# Patient Record
Sex: Male | Born: 1970 | Race: White | Hispanic: No | Marital: Married | Smoking: Current every day smoker
Health system: Southern US, Community
[De-identification: ages and names within clinical notes are randomized; demographics above are authoritative.]

## PROBLEM LIST (undated history)

## (undated) HISTORY — PX: APPENDECTOMY: SHX54

---

## 2008-07-31 ENCOUNTER — Emergency Department: Payer: Self-pay | Admitting: Emergency Medicine

## 2009-01-05 ENCOUNTER — Emergency Department: Payer: Self-pay | Admitting: Emergency Medicine

## 2009-01-22 ENCOUNTER — Emergency Department: Payer: Self-pay | Admitting: Emergency Medicine

## 2009-04-15 ENCOUNTER — Emergency Department: Payer: Self-pay | Admitting: Emergency Medicine

## 2010-02-11 ENCOUNTER — Ambulatory Visit: Payer: Self-pay

## 2014-02-05 ENCOUNTER — Ambulatory Visit: Payer: Self-pay | Admitting: Ophthalmology

## 2014-02-05 LAB — POTASSIUM: POTASSIUM: 3.4 mmol/L — AB (ref 3.5–5.1)

## 2014-02-24 ENCOUNTER — Ambulatory Visit: Payer: Self-pay | Admitting: Ophthalmology

## 2014-03-24 ENCOUNTER — Ambulatory Visit: Payer: Self-pay | Admitting: Ophthalmology

## 2014-11-17 ENCOUNTER — Emergency Department: Payer: Self-pay | Admitting: Emergency Medicine

## 2014-11-24 ENCOUNTER — Emergency Department: Payer: Self-pay | Admitting: Emergency Medicine

## 2014-12-19 NOTE — Op Note (Signed)
PATIENT NAME:  Joseph Frank, [name] MR#:  [id number] DATE OF BIRTH:  [birth year]  DATE OF PROCEDURE:  02/24/2014  PREOPERATIVE DIAGNOSIS: Visually significant cataract of the right eye.   POSTOPERATIVE DIAGNOSIS: Visually significant cataract of the right eye.   OPERATIVE PROCEDURE: Cataract extraction by phacoemulsification with implant of intraocular lens to the right eye.   SURGEON: Galen ManilaWilliam Jadi Deyarmin, MD  ANESTHESIA:  1. Managed anesthesia care.  2. 50-50 mixture of 0.75% bupivacaine and 4% Xylocaine given as a retrobulbar block.   COMPLICATIONS: None.   TECHNIQUE:  Stop and chop.  DESCRIPTION OF PROCEDURE: The patient was examined and consented for this procedure in the preoperative holding area and then brought back to the Operating Room where the anesthesia team employed managed anesthesia care.  3.5 milliliters of the aforementioned mixture were placed in the right orbit on an Atkinson needle without complication. The right eye was then prepped and draped in the usual sterile ophthalmic fashion. A lid speculum was placed. The side-port blade was used to create a paracentesis and the anterior chamber was filled with viscoelastic. The keratome was used to create a near clear corneal incision. The continuous curvilinear capsulorrhexis was performed with a cystotome followed by the capsulorrhexis forceps. Hydrodissection and hydrodelineation were carried out with BSS on a blunt cannula. The lens was removed in a stop and chop technique. The remaining cortical material was removed with the irrigation-aspiration handpiece. The capsular bag was inflated with viscoelastic and the Tecnis ZCB00 19.0-diopter lens, serial number [id number] was placed in the capsular bag without complication. The remaining viscoelastic was removed from the eye with the irrigation-aspiration handpiece. The wounds were hydrated. The anterior chamber was flushed with Miostat and the eye was inflated to a physiologic pressure.  0.1 mL of cefuroxime concentration 10 mg/mL was placed in the anterior chamber. The wounds were found to be water tight. The eye was dressed with Vigamox followed by Maxitrol ointment and a protective shield was placed. The patient will followup with me in one day.   Please note the Vision Blue was used to stain the anterioir capsule as this was an entirely white cataract.   ____________________________ Jerilee FieldWilliam L. Georgiann Neider, MD wlp:dd D: 02/24/2014 14:27:45 ET T: 02/24/2014 22:00:56 ET JOB#: [id number]  cc: Garin Mata L. Mikell Camp, MD, <Dictator> Jerilee FieldWILLIAM L Jaidee Stipe MD ELECTRONICALLY SIGNED 02/26/2014 10:50

## 2015-06-19 ENCOUNTER — Emergency Department
Admission: EM | Admit: 2015-06-19 | Discharge: 2015-06-19 | Disposition: A | Discharge status: Home / Self Care | Payer: Self-pay | Attending: Emergency Medicine | Admitting: Emergency Medicine

## 2015-06-19 ENCOUNTER — Encounter: Payer: Self-pay | Admitting: Emergency Medicine

## 2015-06-19 ENCOUNTER — Emergency Department: Payer: Self-pay

## 2015-06-19 DIAGNOSIS — Z720 Tobacco use: Secondary | ICD-10-CM | POA: Insufficient documentation

## 2015-06-19 DIAGNOSIS — J209 Acute bronchitis, unspecified: Secondary | ICD-10-CM | POA: Insufficient documentation

## 2015-06-19 LAB — CBC
HCT: 41.7 % (ref 40.0–52.0)
Hemoglobin: 14.4 g/dL (ref 13.0–18.0)
MCH: 32 pg (ref 26.0–34.0)
MCHC: 34.5 g/dL (ref 32.0–36.0)
MCV: 92.7 fL (ref 80.0–100.0)
PLATELETS: 200 10*3/uL (ref 150–440)
RBC: 4.5 MIL/uL (ref 4.40–5.90)
RDW: 13.3 % (ref 11.5–14.5)
WBC: 8.3 10*3/uL (ref 3.8–10.6)

## 2015-06-19 LAB — COMPREHENSIVE METABOLIC PANEL
ALK PHOS: 58 U/L (ref 38–126)
ALT: 35 U/L (ref 17–63)
ANION GAP: 8 (ref 5–15)
AST: 25 U/L (ref 15–41)
Albumin: 4.6 g/dL (ref 3.5–5.0)
BUN: 16 mg/dL (ref 6–20)
CALCIUM: 9.8 mg/dL (ref 8.9–10.3)
CO2: 28 mmol/L (ref 22–32)
CREATININE: 0.88 mg/dL (ref 0.61–1.24)
Chloride: 105 mmol/L (ref 101–111)
Glucose, Bld: 90 mg/dL (ref 65–99)
Potassium: 3.8 mmol/L (ref 3.5–5.1)
SODIUM: 141 mmol/L (ref 135–145)
Total Bilirubin: 0.5 mg/dL (ref 0.3–1.2)
Total Protein: 7.9 g/dL (ref 6.5–8.1)

## 2015-06-19 LAB — LIPASE, BLOOD: Lipase: 30 U/L (ref 11–51)

## 2015-06-19 LAB — TROPONIN I

## 2015-06-19 MED ORDER — NAPROXEN 500 MG PO TABS: 500.0000 mg | ORAL_TABLET | Freq: Two times a day (BID) | ORAL | Status: AC

## 2015-06-19 MED ORDER — ALBUTEROL SULFATE HFA 108 (90 BASE) MCG/ACT IN AERS: 2.0000 | INHALATION_SPRAY | RESPIRATORY_TRACT | Status: AC | PRN

## 2015-06-19 MED ORDER — IPRATROPIUM-ALBUTEROL 0.5-2.5 (3) MG/3ML IN SOLN
3.0000 mL | Freq: Once | RESPIRATORY_TRACT | Status: AC
  Administered 2015-06-19: 3 mL via RESPIRATORY_TRACT
  Filled 2015-06-19: qty 3

## 2015-06-19 MED ORDER — PREDNISONE 20 MG PO TABS: 40.0000 mg | ORAL_TABLET | Freq: Every day | ORAL | Status: AC

## 2015-06-19 MED ORDER — PREDNISONE 20 MG PO TABS
60.0000 mg | ORAL_TABLET | Freq: Once | ORAL | Status: AC
  Administered 2015-06-19: 60 mg via ORAL
  Filled 2015-06-19: qty 3

## 2015-06-19 NOTE — ED Notes (Addendum)
Pt c/o intermittent right sided chest pain with right upper quadrant pain for 2-3 weeks; short of breath with pain; denies diaphoresis and N/V with pain; "I know I got the smoker's cough"; nonproductive; currently pain free; has not experienced the pain in at least 2 days; admits he's here because his family made him come; in no acute distress;

## 2015-06-19 NOTE — Discharge Instructions (Signed)

## 2015-06-19 NOTE — ED Provider Notes (Signed)
[facility] Emergency Department Provider Note  ____________________________________________  Time seen: 10:00 PM  I have reviewed the triage vital signs and the nursing notes.   HISTORY  Chief Complaint Chest Pain and Abdominal Pain    HPI Joseph Frank is a 44 y.o. male who complains of occasional right-sided chest pain for the past 2-3 months associated with shortness of breath. No known COPD or asthma although the patient does not have primary care and does not follow up with medical care regularly. Over the past few days he is also having increased coughing that is nonproductive, no fevers or chills. He does feel more short of breath and that he gets winded easily. No nausea vomiting diarrhea or abdominal pain. Chest pain is nonradiating, no aggravating or alleviating factors. Sharp, intermittent and rare, no nausea vomiting or diaphoresis.    History reviewed. No pertinent past medical history.   There are no active problems to display for this patient.    Past Surgical History  Procedure Laterality Date  . Appendectomy       Current Outpatient Rx  Name  Route  Sig  Dispense  Refill  . albuterol (PROVENTIL HFA) 108 (90 BASE) MCG/ACT inhaler   Inhalation   Inhale 2 puffs into the lungs every 4 (four) hours as needed for wheezing or shortness of breath.   1 Inhaler   0   . naproxen (NAPROSYN) 500 MG tablet   Oral   Take 1 tablet (500 mg total) by mouth 2 (two) times daily with a meal.   20 tablet   0   . predniSONE (DELTASONE) 20 MG tablet   Oral   Take 2 tablets (40 mg total) by mouth daily.   8 tablet   0      Allergies Review of patient's allergies indicates no known allergies.   History reviewed. No pertinent family history.  Social History Social History  Substance Use Topics  . Smoking status: Current Every Day Smoker -- 0.50 packs/day    Types: Cigarettes  . Smokeless tobacco: None  . Alcohol Use: Yes      Comment: 3 occasions each year    Review of Systems  Constitutional:   No fever or chills. No weight changes Eyes:   No blurry vision or double vision.  ENT:   No sore throat. Cardiovascular:   Occasional right-sided sharp chest pain, nonradiating  Respiratory:   Shortness of breath and nonproductive cough. Gastrointestinal:   Negative for abdominal pain, vomiting and diarrhea.  No BRBPR or melena. Genitourinary:   Negative for dysuria, urinary retention, bloody urine, or difficulty urinating. Musculoskeletal:   Negative for back pain. No joint swelling or pain. Skin:   Negative for rash. Neurological:   Negative for headaches, focal weakness or numbness. Psychiatric:  No anxiety or depression.   Endocrine:  No hot/cold intolerance, changes in energy, or sleep difficulty.  10-point ROS otherwise negative.  ____________________________________________   PHYSICAL EXAM:  VITAL SIGNS: ED Triage Vitals  Enc Vitals Group     BP 06/19/15 1939 133/81 mmHg     Pulse Rate 06/19/15 1939 96     Resp 06/19/15 1939 18     Temp 06/19/15 1939 98.5 F (36.9 C)     Temp Source 06/19/15 1939 Oral     SpO2 06/19/15 1939 95 %     Weight --      Height 06/19/15 1939 6\' 2"  (1.88 m)     Head Cir --  Peak Flow --      Pain Score 06/19/15 1934 0     Pain Loc --      Pain Edu? --      Excl. in GC? --      Constitutional:   Alert and oriented. Well appearing and in no distress. Eyes:   No scleral icterus. No conjunctival pallor. PERRL. EOMI ENT   Head:   Normocephalic and atraumatic.   Nose:   No congestion/rhinnorhea. No septal hematoma   Mouth/Throat:   MMM, no pharyngeal erythema. No peritonsillar mass. No uvula shift.   Neck:   No stridor. No SubQ emphysema. No meningismus. Hematological/Lymphatic/Immunilogical:   No cervical lymphadenopathy. Cardiovascular:   RRR. Normal and symmetric distal pulses are present in all extremities. No murmurs, rubs, or  gallops. Respiratory:   Mildly prolonged expirations. Slight wheeze. With forceful rapid expiration, the wheezes accentuated and provokes a bronchospastic cough.. Gastrointestinal:   Soft and nontender. No distention. There is no CVA tenderness.  No rebound, rigidity, or guarding. Genitourinary:   deferred Musculoskeletal:   Nontender with normal range of motion in all extremities. No joint effusions.  No lower extremity tenderness.  No edema. Neurologic:   Normal speech and language.  CN 2-10 normal. Motor grossly intact. No pronator drift.  Normal gait. No gross focal neurologic deficits are appreciated.  Skin:    Skin is warm, dry and intact. No rash noted.  No petechiae, purpura, or bullae. Psychiatric:   Mood and affect are normal. Speech and behavior are normal. Patient exhibits appropriate insight and judgment.  ____________________________________________    LABS (pertinent positives/negatives) (all labs ordered are listed, but only abnormal results are displayed) Labs Reviewed  LIPASE, BLOOD  CBC  COMPREHENSIVE METABOLIC PANEL  TROPONIN I   ____________________________________________   EKG  Interpreted by me Normal sinus rhythm rate of 86, normal axis and intervals, normal QRS. T wave inversions in inferior leads, no ST changes. Unchanged from June 2015 ____________________________________________    RADIOLOGY  Chest x-ray consistent with acute bronchitis  ____________________________________________   PROCEDURES   ____________________________________________   INITIAL IMPRESSION / ASSESSMENT AND PLAN / ED COURSE  Pertinent labs & imaging results that were available during my care of the patient were reviewed by me and considered in my medical decision making (see chart for details).  Patient presents with shortness of breath and chest pain that is overall chronic in nature. However, his chest x-ray and physical exam are consistent with bronchitis.  We'll give him steroids and albuterol and have him follow up with primary care. Low suspicion for ACS PE TAD pneumothorax carditis mediastinitis pneumonia sepsis, the patient's very well appearing and has normal vital signs.     ____________________________________________   FINAL CLINICAL IMPRESSION(S) / ED DIAGNOSES  Final diagnoses:  Acute bronchitis, unspecified organism      Sharman Cheek, MD 06/19/15 2307

## 2015-11-21 ENCOUNTER — Encounter: Payer: Self-pay | Admitting: Emergency Medicine

## 2015-11-21 ENCOUNTER — Emergency Department: Payer: No Typology Code available for payment source

## 2015-11-21 ENCOUNTER — Emergency Department
Admission: EM | Admit: 2015-11-21 | Discharge: 2015-11-21 | Disposition: A | Discharge status: Home / Self Care | Payer: No Typology Code available for payment source | Attending: Emergency Medicine | Admitting: Emergency Medicine

## 2015-11-21 DIAGNOSIS — S0083XA Contusion of other part of head, initial encounter: Secondary | ICD-10-CM

## 2015-11-21 DIAGNOSIS — Z79899 Other long term (current) drug therapy: Secondary | ICD-10-CM | POA: Diagnosis not present

## 2015-11-21 DIAGNOSIS — S0181XA Laceration without foreign body of other part of head, initial encounter: Secondary | ICD-10-CM | POA: Diagnosis present

## 2015-11-21 DIAGNOSIS — S0101XA Laceration without foreign body of scalp, initial encounter: Secondary | ICD-10-CM

## 2015-11-21 DIAGNOSIS — Z791 Long term (current) use of non-steroidal anti-inflammatories (NSAID): Secondary | ICD-10-CM | POA: Diagnosis not present

## 2015-11-21 DIAGNOSIS — F17210 Nicotine dependence, cigarettes, uncomplicated: Secondary | ICD-10-CM | POA: Insufficient documentation

## 2015-11-21 DIAGNOSIS — Y9389 Activity, other specified: Secondary | ICD-10-CM | POA: Diagnosis not present

## 2015-11-21 DIAGNOSIS — Z7952 Long term (current) use of systemic steroids: Secondary | ICD-10-CM | POA: Insufficient documentation

## 2015-11-21 DIAGNOSIS — T148XXA Other injury of unspecified body region, initial encounter: Secondary | ICD-10-CM

## 2015-11-21 DIAGNOSIS — Y9289 Other specified places as the place of occurrence of the external cause: Secondary | ICD-10-CM | POA: Insufficient documentation

## 2015-11-21 DIAGNOSIS — S40011A Contusion of right shoulder, initial encounter: Secondary | ICD-10-CM

## 2015-11-21 DIAGNOSIS — Y998 Other external cause status: Secondary | ICD-10-CM | POA: Insufficient documentation

## 2015-11-21 DIAGNOSIS — T148 Other injury of unspecified body region: Secondary | ICD-10-CM | POA: Insufficient documentation

## 2015-11-21 MED ORDER — ACETAMINOPHEN 325 MG PO TABS
ORAL_TABLET | ORAL | Status: AC
  Administered 2015-11-21: 650 mg via ORAL
  Filled 2015-11-21: qty 2

## 2015-11-21 MED ORDER — ACETAMINOPHEN 325 MG PO TABS
650.0000 mg | ORAL_TABLET | Freq: Once | ORAL | Status: AC
  Administered 2015-11-21: 650 mg via ORAL
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
## 2015-11-21 NOTE — ED Notes (Signed)
Suture cart at bedside 
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
## 2015-11-21 NOTE — ED Notes (Signed)
Patient returned from CT

## 2015-11-21 NOTE — ED Notes (Signed)
Discussed discharge instructions and follow-up care with patient. No questions or concerns at this time. Pt stable at discharge.  
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
## 2015-11-21 NOTE — ED Notes (Signed)
[facility] Nurse consulted to provide counseling resources to patient.  Call her back @ [phone] Rosann Auerbach(Trish) once patient is medically cleared.

## 2015-11-21 NOTE — ED Notes (Signed)
Patient transported to CT 

## 2015-11-21 NOTE — Discharge Instructions (Signed)
You may take over-the-counter Tylenol or Aleve as needed for pain.  Scalp staples need to come out in about 10 days, may make an appointment with [facility].  For counseling evaluation and follow-up, you may call "[facility]" on Monday to make an appointment.  [phone]   Facial or Scalp Contusion A facial or scalp contusion is a deep bruise on the face or head. Injuries to the face and head generally cause a lot of swelling, especially around the eyes. Contusions are the result of an injury that caused bleeding under the skin. The contusion may turn blue, purple, or yellow. Minor injuries will give you a painless contusion, but more severe contusions may stay painful and swollen for a few weeks.  CAUSES  A facial or scalp contusion is caused by a blunt injury or trauma to the face or head area.  SIGNS AND SYMPTOMS   Swelling of the injured area.   Discoloration of the injured area.   Tenderness, soreness, or pain in the injured area.  DIAGNOSIS  The diagnosis can be made by taking a medical history and doing a physical exam. An X-ray exam, CT scan, or MRI may be needed to determine if there are any associated injuries, such as broken bones (fractures). TREATMENT  Often, the best treatment for a facial or scalp contusion is applying cold compresses to the injured area. Over-the-counter medicines may also be recommended for pain control.  HOME CARE INSTRUCTIONS   Only take over-the-counter or prescription medicines as directed by your health care provider.   Apply ice to the injured area.   Put ice in a plastic bag.   Place a towel between your skin and the bag.   Leave the ice on for 20 minutes, 2-3 times a day.  SEEK MEDICAL CARE IF:  You have bite problems.   You have pain with chewing.   You are concerned about facial defects. SEEK IMMEDIATE MEDICAL CARE IF:  You have severe pain or a headache that is not relieved by medicine.   You have unusual  sleepiness, confusion, or personality changes.   You throw up (vomit).   You have a persistent nosebleed.   You have double vision or blurred vision.   You have fluid drainage from your nose or ear.   You have difficulty walking or using your arms or legs.  MAKE SURE YOU:   Understand these instructions.  Will watch your condition.  Will get help right away if you are not doing well or get worse.   This information is not intended to replace advice given to you by your health care provider. Make sure you discuss any questions you have with your health care provider.   Document Released: 09/21/2004 Document Revised: 09/04/2014 Document Reviewed: 03/27/2013 Elsevier Interactive Patient Education 2016 [employer]  Laceration Care, Adult A laceration is a cut that goes through all layers of the skin. The cut also goes into the tissue that is right under the skin. Some cuts heal on their own. Others need to be closed with stitches (sutures), staples, skin adhesive strips, or wound glue. Taking care of your cut lowers your risk of infection and helps your cut to heal better. HOW TO TAKE CARE OF YOUR CUT For stitches or staples:  Keep the wound clean and dry.  If you were given a bandage (dressing), you should change it at least one time per day or as told by your doctor. You should also change it if it gets  wet or dirty.  Keep the wound completely dry for the first 24 hours or as told by your doctor. After that time, you may take a shower or a bath. However, make sure that the wound is not soaked in water until after the stitches or staples have been removed.  Clean the wound one time each day or as told by your doctor:  Wash the wound with soap and water.  Rinse the wound with water until all of the soap comes off.  Pat the wound dry with a clean towel. Do not rub the wound.  After you clean the wound, put a thin layer of antibiotic ointment on it as told by your  doctor. This ointment:  Helps to prevent infection.  Keeps the bandage from sticking to the wound.  Have your stitches or staples removed as told by your doctor. If your doctor used skin adhesive strips:   Keep the wound clean and dry.  If you were given a bandage, you should change it at least one time per day or as told by your doctor. You should also change it if it gets dirty or wet.  Do not get the skin adhesive strips wet. You can take a shower or a bath, but be careful to keep the wound dry.  If the wound gets wet, pat it dry with a clean towel. Do not rub the wound.  Skin adhesive strips fall off on their own. You can trim the strips as the wound heals. Do not remove any strips that are still stuck to the wound. They will fall off after a while. If your doctor used wound glue:  Try to keep your wound dry, but you may briefly wet it in the shower or bath. Do not soak the wound in water, such as by swimming.  After you take a shower or a bath, gently pat the wound dry with a clean towel. Do not rub the wound.  Do not do any activities that will make you really sweaty until the skin glue has fallen off on its own.  Do not apply liquid, cream, or ointment medicine to your wound while the skin glue is still on.  If you were given a bandage, you should change it at least one time per day or as told by your doctor. You should also change it if it gets dirty or wet.  If a bandage is placed over the wound, do not let the tape for the bandage touch the skin glue.  Do not pick at the glue. The skin glue usually stays on for 5-10 days. Then, it falls off of the skin. General Instructions  To help prevent scarring, make sure to cover your wound with sunscreen whenever you are outside after stitches are removed, after adhesive strips are removed, or when wound glue stays in place and the wound is healed. Make sure to wear a sunscreen of at least 30 SPF.  Take over-the-counter and  prescription medicines only as told by your doctor.  If you were given antibiotic medicine or ointment, take or apply it as told by your doctor. Do not stop using the antibiotic even if your wound is getting better.  Do not scratch or pick at the wound.  Keep all follow-up visits as told by your doctor. This is important.  Check your wound every day for signs of infection. Watch for:  Redness, swelling, or pain.  Fluid, blood, or pus.  Raise (elevate) the injured area  above the level of your heart while you are sitting or lying down, if possible. GET HELP IF:  You got a tetanus shot and you have any of these problems at the injection site:  Swelling.  Very bad pain.  Redness.  Bleeding.  You have a fever.  A wound that was closed breaks open.  You notice a bad smell coming from your wound or your bandage.  You notice something coming out of the wound, such as wood or glass.  Medicine does not help your pain.  You have more redness, swelling, or pain at the site of your wound.  You have fluid, blood, or pus coming from your wound.  You notice a change in the color of your skin near your wound.  You need to change the bandage often because fluid, blood, or pus is coming from the wound.  You start to have a new rash.  You start to have numbness around the wound. GET HELP RIGHT AWAY IF:  You have very bad swelling around the wound.  Your pain suddenly gets worse and is very bad.  You notice painful lumps near the wound or on skin that is anywhere on your body.  You have a red streak going away from your wound.  The wound is on your hand or foot and you cannot move a finger or toe like you usually can.  The wound is on your hand or foot and you notice that your fingers or toes look pale or bluish.   This information is not intended to replace advice given to you by your health care provider. Make sure you discuss any questions you have with your health care  provider.   Document Released: 01/31/2008 Document Revised: 12/29/2014 Document Reviewed: 08/10/2014 Elsevier Interactive Patient Education [employer]

## 2015-11-21 NOTE — ED Notes (Signed)
Patient asking for Tylenol for HA pain, will ask Dr. Shaune PollackLord for an order.

## 2015-11-30 DIAGNOSIS — Z5321 Procedure and treatment not carried out due to patient leaving prior to being seen by health care provider: Secondary | ICD-10-CM | POA: Insufficient documentation

## 2015-11-30 DIAGNOSIS — F17210 Nicotine dependence, cigarettes, uncomplicated: Secondary | ICD-10-CM | POA: Insufficient documentation
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
## 2015-12-01 ENCOUNTER — Emergency Department
Admission: EM | Admit: 2015-12-01 | Discharge: 2015-12-01 | Disposition: A | Discharge status: Home / Self Care | Payer: Self-pay | Attending: Emergency Medicine | Admitting: Emergency Medicine
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
